# Patient Record
Sex: Male | Born: 1990 | Race: White | Hispanic: No
Health system: Southern US, Community
[De-identification: ages and names within clinical notes are randomized; demographics above are authoritative.]

---
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
## 2008-01-12 ENCOUNTER — Other Ambulatory Visit: Payer: Self-pay

## 2008-01-12 ENCOUNTER — Emergency Department: Payer: Self-pay | Admitting: Emergency Medicine

## 2013-04-18 ENCOUNTER — Emergency Department: Payer: Self-pay

## 2019-12-28 ENCOUNTER — Ambulatory Visit: Payer: Self-pay | Attending: Internal Medicine

## 2019-12-28 DIAGNOSIS — Z23 Encounter for immunization: Secondary | ICD-10-CM

## 2019-12-28 NOTE — Progress Notes (Signed)
   Covid-19 Vaccination Clinic  Name:  FORDYCE LEPAK    MRN: [id number] DOB: 1991/08/28  12/28/2019  Mr. Pallas was observed post Covid-19 immunization for 15 minutes without incident. He was provided with Vaccine Information Sheet and instruction to access the V-Safe system.   Mr. Conley was instructed to call 911 with any severe reactions post vaccine: Marland Kitchen Difficulty breathing  . Swelling of face and throat  . A fast heartbeat  . A bad rash all over body  . Dizziness and weakness   Immunizations Administered    Name Date Dose VIS Date Route   Pfizer COVID-19 Vaccine 12/28/2019  5:04 PM 0.3 mL 08/24/2019 Intramuscular   Manufacturer: [employer], [employer]   Lot: [id number]   NDC: [address]-8

## 2020-01-22 ENCOUNTER — Ambulatory Visit: Payer: Self-pay | Attending: Internal Medicine

## 2020-01-22 DIAGNOSIS — Z23 Encounter for immunization: Secondary | ICD-10-CM

## 2020-01-22 NOTE — Progress Notes (Signed)
   Covid-19 Vaccination Clinic  Name:  Michael Rojas    MRN: [id number] DOB: December 05, 1990  01/22/2020  Michael Rojas was observed post Covid-19 immunization for 15 minutes without incident. He was provided with Vaccine Information Sheet and instruction to access the V-Safe system.   Michael Rojas was instructed to call 911 with any severe reactions post vaccine: Marland Kitchen Difficulty breathing  . Swelling of face and throat  . A fast heartbeat  . A bad rash all over body  . Dizziness and weakness   Immunizations Administered    Name Date Dose VIS Date Route   Pfizer COVID-19 Vaccine 01/22/2020  4:55 PM 0.3 mL 11/07/2018 Intramuscular   Manufacturer: [employer], [employer]   Lot: [id number]   NDC: [address]-2

## 2020-07-12 ENCOUNTER — Other Ambulatory Visit: Payer: Self-pay

## 2020-07-12 ENCOUNTER — Encounter (HOSPITAL_COMMUNITY): Payer: Self-pay

## 2020-07-12 ENCOUNTER — Emergency Department (HOSPITAL_COMMUNITY)
Admission: EM | Admit: 2020-07-12 | Discharge: 2020-07-13 | Disposition: A | Discharge status: Home / Self Care | Payer: BC Managed Care – PPO | Attending: Emergency Medicine | Admitting: Emergency Medicine

## 2020-07-12 ENCOUNTER — Emergency Department (HOSPITAL_COMMUNITY): Payer: BC Managed Care – PPO

## 2020-07-12 DIAGNOSIS — Y9389 Activity, other specified: Secondary | ICD-10-CM | POA: Diagnosis not present

## 2020-07-12 DIAGNOSIS — S0990XA Unspecified injury of head, initial encounter: Secondary | ICD-10-CM | POA: Insufficient documentation

## 2020-07-12 DIAGNOSIS — W19XXXA Unspecified fall, initial encounter: Secondary | ICD-10-CM

## 2020-07-12 DIAGNOSIS — S3992XA Unspecified injury of lower back, initial encounter: Secondary | ICD-10-CM

## 2020-07-12 DIAGNOSIS — S34139A Unspecified injury to sacral spinal cord, initial encounter: Secondary | ICD-10-CM | POA: Insufficient documentation

## 2020-07-12 DIAGNOSIS — W1789XA Other fall from one level to another, initial encounter: Secondary | ICD-10-CM | POA: Diagnosis not present

## 2020-07-12 MED ORDER — IBUPROFEN 800 MG PO TABS
800.0000 mg | ORAL_TABLET | Freq: Once | ORAL | Status: AC
  Administered 2020-07-12: 800 mg via ORAL
  Filled 2020-07-12: qty 1

## 2020-07-12 NOTE — ED Provider Notes (Signed)
[facility]-EMERGENCY DEPT Provider Note   CSN: [id number] Arrival date & time: 07/12/20  1934     History Chief Complaint  Patient presents with  . Fall    Michael Rojas is a 29 y.o. male.  The history is provided by the patient and medical records.  Fall   29 year old male presenting to the ED after a fall.  He was completing the ring portion of an obstacle course about 10 feet in the air when he lost his grip and fell.  He landed straight on his buttocks and then struck his head on the ground.  He was on a dirt surface.  He is unsure of loss of consciousness but suspect so as he does have gap in his memory for a bit.  This actually occurred in [place] and he was taken to a local facility there but due to prolonged wait friend drove him back home to [place] where he lives.  Significant other at the bedside reports he has had some repetitive questioning.  Patient denies feeling confused, dizzy, significant headache, tinnitus, changes in speech, difficulty walking, or blurred vision.  He is not on any anticoagulation.  His main complaint is tailbone pain from area of impact.  Denies any neck or back pain.  He has not had any numbness or weakness of the extremities.  No bowel or bladder incontinence.  No meds prior to arrival.  History reviewed. No pertinent past medical history.  There are no problems to display for this patient.   History reviewed. No pertinent surgical history.     No family history on file.  Social History   Tobacco Use  . Smoking status: Not on file  Substance Use Topics  . Alcohol use: Not on file  . Drug use: Not on file    Home Medications Prior to Admission medications   Not on File    Allergies    Patient has no known allergies.  Review of Systems   Review of Systems  Musculoskeletal: Positive for arthralgias.  All other systems reviewed and are negative.   Physical Exam Updated Vital Signs BP 136/88 (BP  Location: Left Arm)   Pulse 79   Temp 98.8 F (37.1 C) (Oral)   Resp 15   Ht 5\' 9"  (1.753 m)   Wt 90.7 kg   SpO2 97%   BMI 29.53 kg/m   Physical Exam Vitals and nursing note reviewed.  Constitutional:      Appearance: He is well-developed.  HENT:     Head: Normocephalic and atraumatic.     Right Ear: Tympanic membrane and ear canal normal.     Left Ear: Tympanic membrane and ear canal normal.     Nose: Nose normal.  Eyes:     Conjunctiva/sclera: Conjunctivae normal.     Pupils: Pupils are equal, round, and reactive to light.     Comments: PERRL  Cardiovascular:     Rate and Rhythm: Normal rate and regular rhythm.     Heart sounds: Normal heart sounds.  Pulmonary:     Effort: Pulmonary effort is normal. No respiratory distress.     Breath sounds: Normal breath sounds. No rhonchi.  Abdominal:     General: Bowel sounds are normal.     Palpations: Abdomen is soft.  Musculoskeletal:        General: Normal range of motion.     Cervical back: Normal range of motion.     Comments: No midline tenderness or deformity  of C/T/L spine Tenderness and bruising noted to sacrum and somewhat into right buttock, there is no gross deformity, able to stand and maneuver around as normal, no strength or sensory deficit of the extremities  Skin:    General: Skin is warm and dry.  Neurological:     Mental Status: He is alert and oriented to person, place, and time.     Comments: AAOx3, answering questions and following commands appropriately; equal strength UE and LE bilaterally; CN grossly intact; moves all extremities appropriately without ataxia; no focal neuro deficits or facial asymmetry appreciated, speech clear and goal oriented, no repetitive questioning noted during exam     ED Results / Procedures / Treatments   Labs (all labs ordered are listed, but only abnormal results are displayed) Labs Reviewed - No data to display  EKG None  Radiology DG Sacrum/Coccyx  Result Date:  07/12/2020 CLINICAL DATA:  Fall from height, sacral pain EXAM: SACRUM AND COCCYX - 2+ VIEW COMPARISON:  None. FINDINGS: There is no evidence of fracture or other focal bone lesions. IMPRESSION: Negative. Electronically Signed   By: Helyn Numbers MD   On: 07/12/2020 22:34   CT Head Wo Contrast  Result Date: 07/12/2020 CLINICAL DATA:  Fall EXAM: CT HEAD WITHOUT CONTRAST TECHNIQUE: Contiguous axial images were obtained from the base of the skull through the vertex without intravenous contrast. COMPARISON:  None. FINDINGS: Brain: No acute intracranial abnormality. Specifically, no hemorrhage, hydrocephalus, mass lesion, acute infarction, or significant intracranial injury. Vascular: No hyperdense vessel or unexpected calcification. Skull: No acute calvarial abnormality. Sinuses/Orbits: Visualized paranasal sinuses and mastoids clear. Orbital soft tissues unremarkable. Other: None IMPRESSION: Normal study. Electronically Signed   By: Charlett Nose M.D.   On: 07/12/2020 22:38    Procedures Procedures (including critical care time)  Medications Ordered in ED Medications  ibuprofen (ADVIL) tablet 800 mg (800 mg Oral Given 07/12/20 2248)    ED Course  I have reviewed the triage vital signs and the nursing notes.  Pertinent labs & imaging results that were available during my care of the patient were reviewed by me and considered in my medical decision making (see chart for details).    MDM Rules/Calculators/A&P  29 year old male presenting to the ED after 10 foot fall from obstacle course.  Landed on his tailbone followed by head injury against dirt.  Seems like there was loss of consciousness.  Significant other at the bedside reports there is been some repetitive questioning.  On my exam he is awake, alert, appropriately oriented.  He does not have any focal neurologic deficits.  His speech is clear and goal oriented, I am not appreciating any repetitive questioning.  He has no midline neck or back  pain.  Does have some tenderness over the sacrum and some bruising to the right upper buttock.  Imaging was obtained of head and sacrum without any acute findings.  He may have mild concussion with reported repetitive questioning.  He remains hemodynamically stable without focal deficits.  No he stable for discharge home with symptomatic care.  I have given significant other concussive precautions that would warrant ED return.  They will follow up with PCP.  Return here for any new or acute changes.  Final Clinical Impression(s) / ED Diagnoses Final diagnoses:  Fall, initial encounter  Injury of head, initial encounter  Injury of coccyx, initial encounter    Rx / DC Orders ED Discharge Orders    None       Sharilyn Sites  M, PA-C 07/12/20 2352    Charlynne Pander, MD 07/13/20 1450

## 2020-07-12 NOTE — ED Triage Notes (Signed)
Pt reports attending a [employer]artan race and falling off of one of the obstacles landing on tail bone and hitting head on ground. Per pt support person pt repeating himself.

## 2020-07-12 NOTE — ED Notes (Signed)
Pt to ER complaining of pain to tailbone after falling from monkey bars.  Pt states that he was completing an obstacle course and fell.  Pt reports landing on his tailbone, but also struck the back of his head in the dirt.  Pt unsure of whether or not there was any LOC.  Respirations even and unlabored.  NADN.  Will continue to monitor.

## 2020-07-12 NOTE — Discharge Instructions (Signed)
Imaging today was negative.  There may be mild concussion which can cause headache, nausea, fogginess, etc.  Please see attached instructions for supportive care and symptoms that would warrant ED return. Can alternate Tylenol and Motrin for pain control.  Can take up to 2000mg  tylenol daily and up to 800mg  motrin 3x daily. Follow-up with your primary care doctor. Return here for any new/acute changes.

## 2021-06-08 IMAGING — CT CT HEAD W/O CM
3 of 4 series · 14 of 47 positions shown, 16 images · non-contrast
Comparison: None.

CLINICAL DATA: Fall

EXAM:
CT HEAD WITHOUT CONTRAST
TECHNIQUE: Contiguous axial images were obtained from the base of the skull
through the vertex without intravenous contrast.

[Series 5: coronal soft tissue · coronal · 0.33mm/px · 3 of 73 slices shown]
[im 25/73  brain]
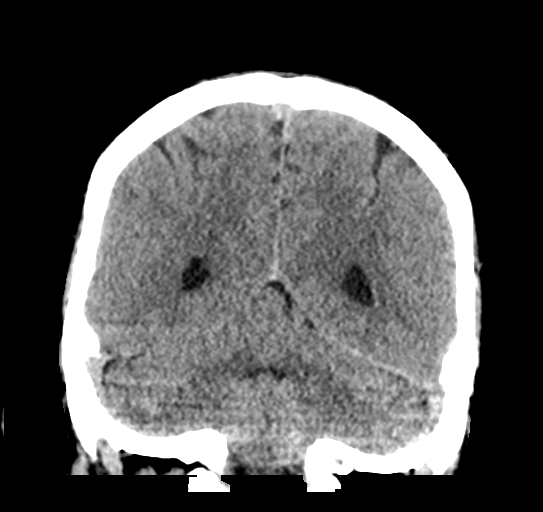
[im 33/73  brain]
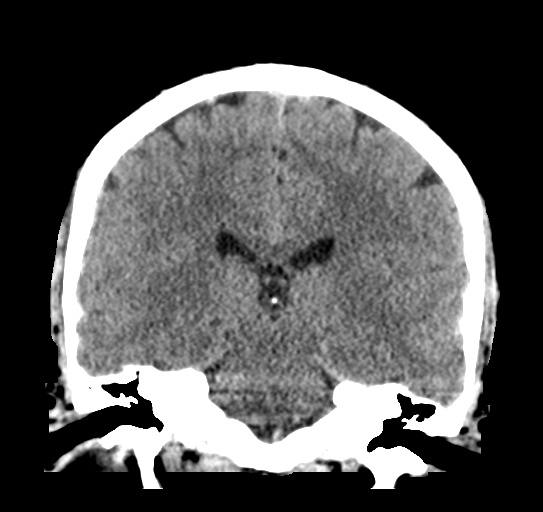
[im 41/73  brain]
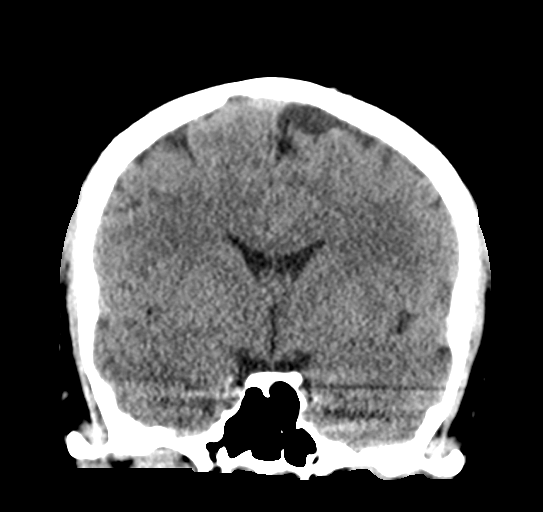

[Series 6: sagittal soft tissue · sagittal · 0.34mm/px · 3 of 58 slices shown]
[im 20/58  brain]
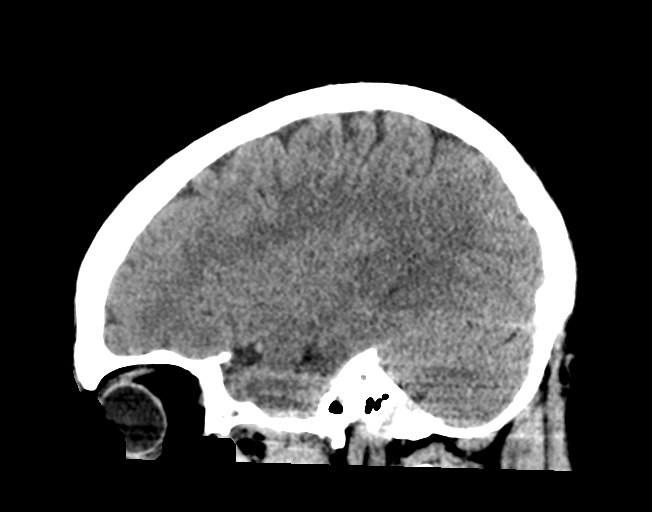
[im 29/58  brain]
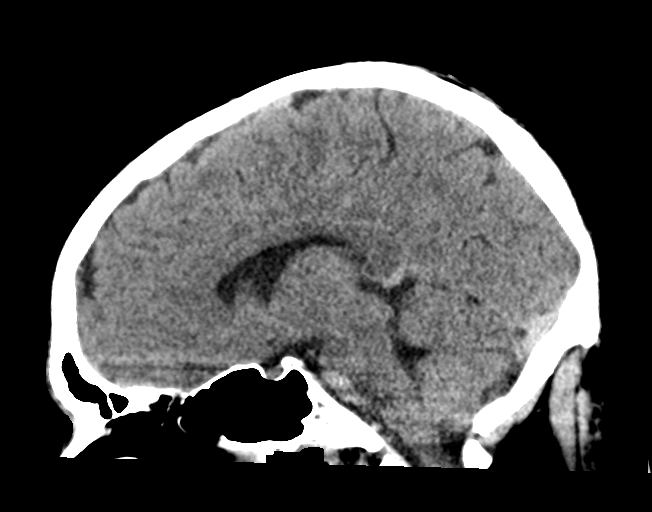
[im 39/58  brain]
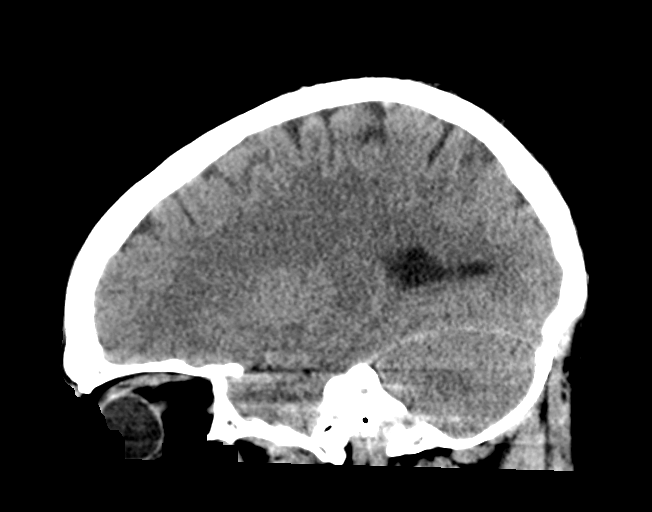

[Series 7: true axial · axial · 0.34mm/px · z∈[-135,-10]mm · 8 of 55 slices shown, 10 images]
[im 7/55  brain]
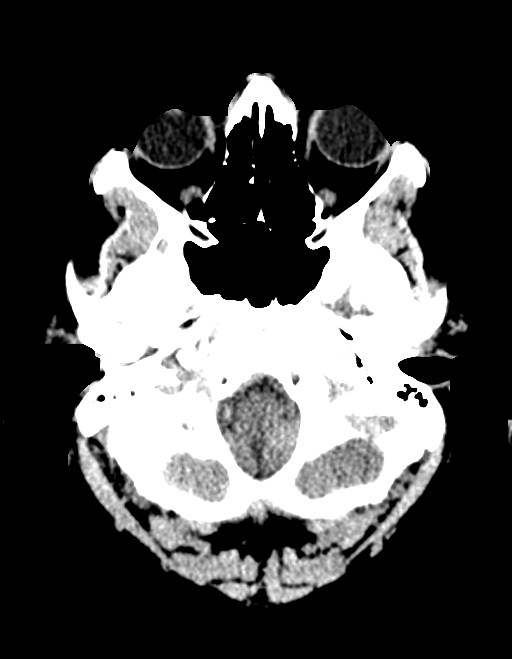
[im 7/55  bone]
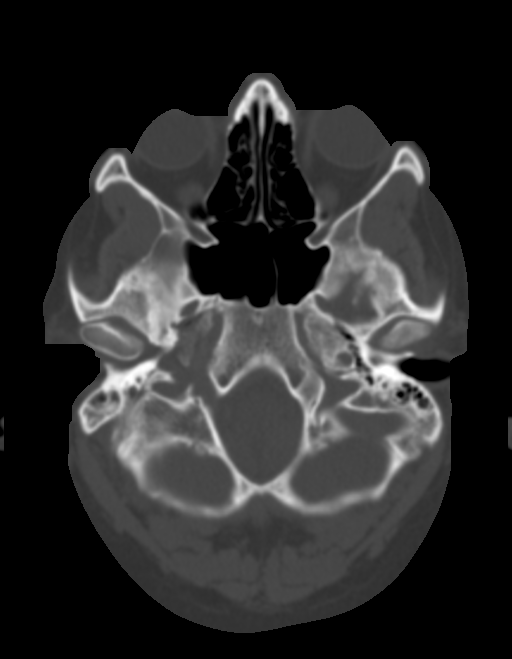
[im 13/55  brain]
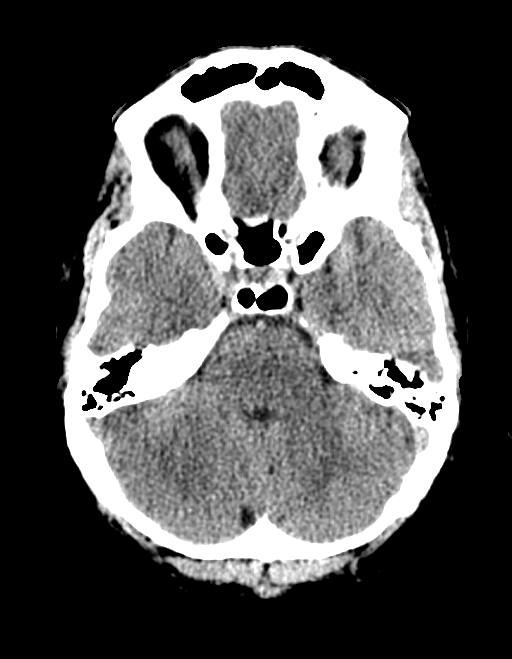
[im 19/55  brain]
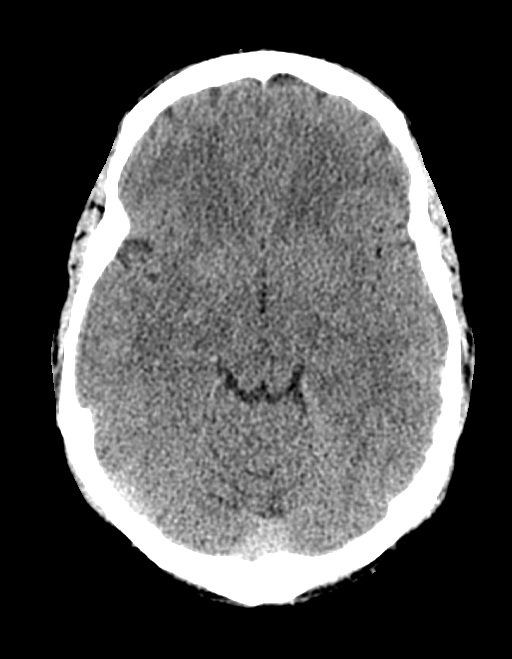
[im 25/55  brain]
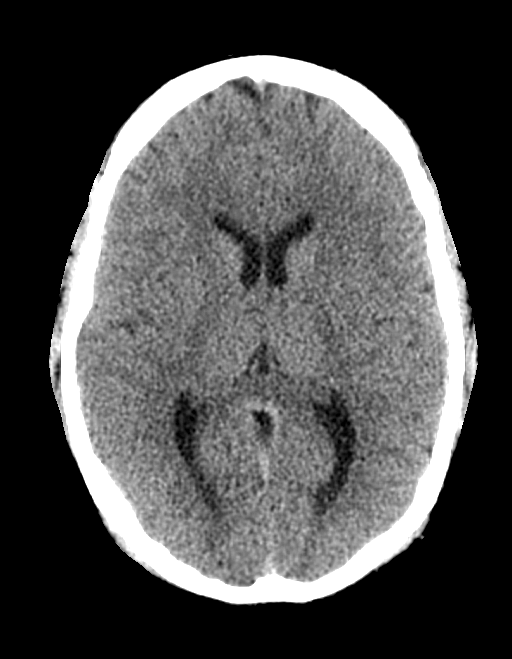
[im 31/55  brain]
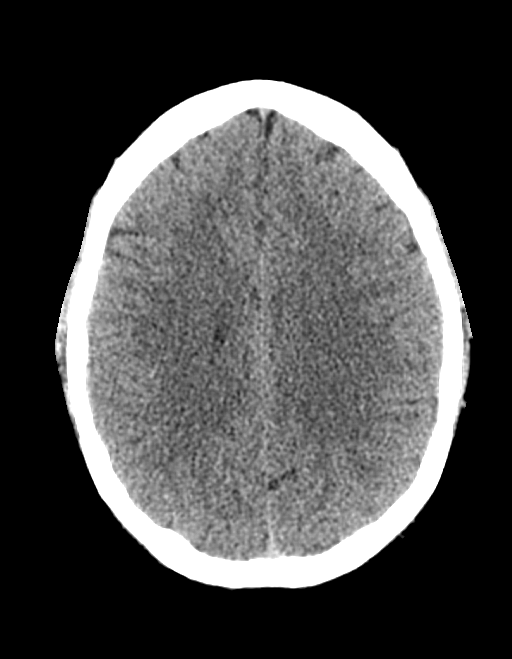
[im 31/55  bone]
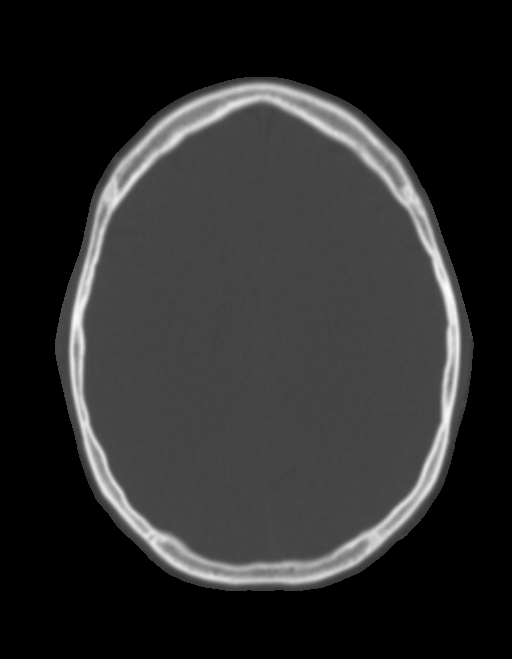
[im 37/55  brain]
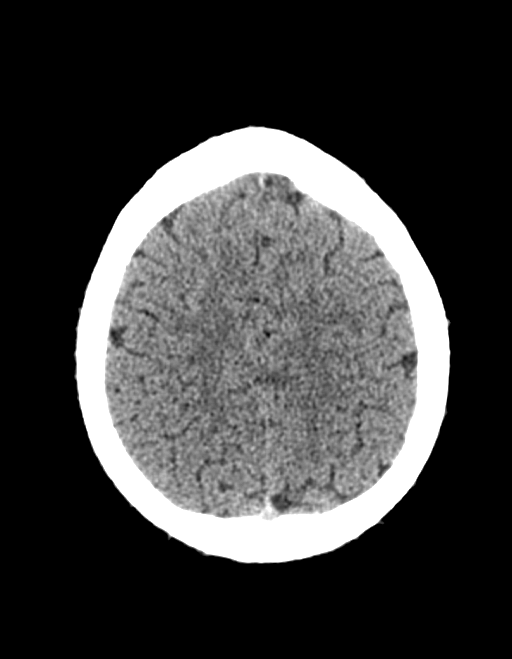
[im 43/55  brain]
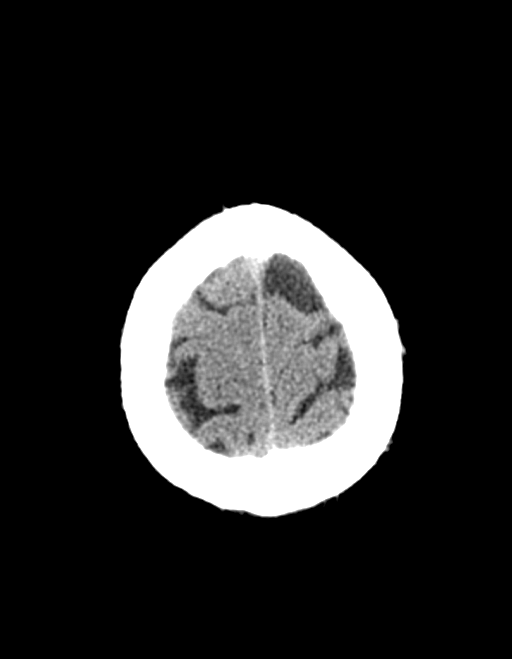
[im 49/55  brain]
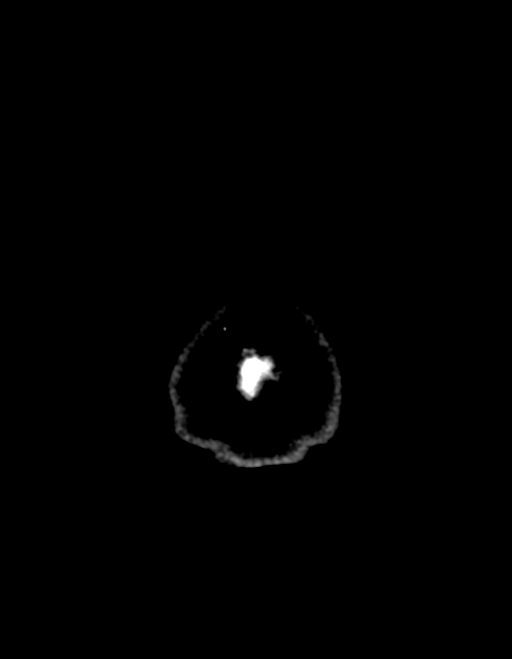

[14 of 47 positions shown; findings below may reference images not displayed]

FINDINGS: Brain: No acute intracranial abnormality. Specifically, no
hemorrhage, hydrocephalus, mass lesion, acute infarction, or
significant intracranial injury.

Vascular: No hyperdense vessel or unexpected calcification.

Skull: No acute calvarial abnormality.

Sinuses/Orbits: Visualized paranasal sinuses and mastoids clear.
Orbital soft tissues unremarkable.

Other: None
IMPRESSION: Normal study.
